# Patient Record
Sex: Female | Born: 1995 | Race: White | Hispanic: No | Marital: Married | Smoking: Never smoker
Health system: Southern US, Community
[De-identification: ages and names within clinical notes are randomized; demographics above are authoritative.]

## PROBLEM LIST (undated history)

## (undated) DIAGNOSIS — Z8481 Family history of carrier of genetic disease: Secondary | ICD-10-CM

## (undated) DIAGNOSIS — Z803 Family history of malignant neoplasm of breast: Secondary | ICD-10-CM

## (undated) DIAGNOSIS — Z808 Family history of malignant neoplasm of other organs or systems: Secondary | ICD-10-CM

## (undated) HISTORY — DX: Family history of malignant neoplasm of other organs or systems: Z80.8

## (undated) HISTORY — DX: Family history of carrier of genetic disease: Z84.81

## (undated) HISTORY — DX: Family history of malignant neoplasm of breast: Z80.3

---

## 2015-09-30 ENCOUNTER — Ambulatory Visit (HOSPITAL_COMMUNITY)
Admission: EM | Admit: 2015-09-30 | Discharge: 2015-09-30 | Disposition: A | Discharge status: Home / Self Care | Payer: 59 | Attending: Internal Medicine | Admitting: Internal Medicine

## 2015-09-30 ENCOUNTER — Encounter (HOSPITAL_COMMUNITY): Payer: Self-pay | Admitting: Emergency Medicine

## 2015-09-30 DIAGNOSIS — Z79899 Other long term (current) drug therapy: Secondary | ICD-10-CM | POA: Diagnosis not present

## 2015-09-30 DIAGNOSIS — K120 Recurrent oral aphthae: Secondary | ICD-10-CM | POA: Diagnosis not present

## 2015-09-30 DIAGNOSIS — J029 Acute pharyngitis, unspecified: Secondary | ICD-10-CM | POA: Diagnosis not present

## 2015-09-30 LAB — POCT RAPID STREP A: Streptococcus, Group A Screen (Direct): NEGATIVE

## 2015-09-30 MED ORDER — MAGIC MOUTHWASH W/LIDOCAINE: 10.0000 mL | Freq: Three times a day (TID) | ORAL | 0 refills | Status: DC | PRN

## 2015-09-30 MED ORDER — CHLORHEXIDINE GLUCONATE 0.12% ORAL RINSE (MEDLINE KIT): 15.0000 mL | Freq: Two times a day (BID) | OROMUCOSAL | 0 refills | Status: DC

## 2015-09-30 NOTE — ED Triage Notes (Signed)
Pt is here for ST onset 9/19 associated w/canker sore inside lower lip  Denies fevers, chills   A&O x4... NAD

## 2015-09-30 NOTE — ED Provider Notes (Signed)
CSN: [id number]     Arrival date & time 09/30/15  1012 History   First MD Initiated Contact with Patient 09/30/15 1056     Chief Complaint  Patient presents with  . Sore Throat   (Consider location/radiation/quality/duration/timing/severity/associated sxs/prior Treatment) HPI Miranda Moran is a 20 y.o. female presenting to UC with c/o canker sore as well as sore throat that started about 2 days ago.  She initially developed a sore on her lower inside lip, then gradually progressed to her throat.  No known sick contacts. She has had smaller canker sores in the past after drinking acidic drinks but this sore seems worse than prior sores.  Denies fever, chills, n/v/d. Denies cough or congestion. No sick contacts or recent travel. Denies headaches.    History reviewed. No pertinent past medical history. History reviewed. No pertinent surgical history. No family history on file. Social History  Substance Use Topics  . Smoking status: Never Smoker  . Smokeless tobacco: Never Used  . Alcohol use Yes   OB History    No data available     Review of Systems  Constitutional: Negative for chills and fever.  HENT: Positive for mouth sores and sore throat. Negative for congestion, ear pain, rhinorrhea and sinus pressure.   Respiratory: Negative for cough and shortness of breath.   Cardiovascular: Negative for chest pain and palpitations.  Gastrointestinal: Negative for abdominal pain, diarrhea, nausea and vomiting.    Allergies  Review of patient's allergies indicates no known allergies.  Home Medications   Prior to Admission medications   Medication Sig Start Date End Date Taking? Authorizing Provider  chlorhexidine gluconate, MEDLINE KIT, (PERIDEX) 0.12 % solution Use as directed 15 mLs in the mouth or throat 2 (two) times daily. Swish, gargle and spit 09/30/15   Noland Fordyce, PA-C  magic mouthwash w/lidocaine SOLN Take 10 mLs by mouth 3 (three) times daily as needed for mouth pain.  Swish, gargle, and spit 09/30/15   Noland Fordyce, PA-C   Meds Ordered and Administered this Visit  Medications - No data to display  BP 113/56 (BP Location: Right Arm)   Pulse 86   Temp 98.1 F (36.7 C) (Oral)   Resp 16   LMP 09/13/2015   SpO2 100%  No data found.   Physical Exam  Constitutional: She is oriented to person, place, and time. She appears well-developed and well-nourished. No distress.  HENT:  Head: Normocephalic and atraumatic.  Right Ear: Tympanic membrane normal.  Left Ear: Tympanic membrane normal.  Nose: Nose normal.  Mouth/Throat: Uvula is midline and mucous membranes are normal. Oral lesions present. Posterior oropharyngeal erythema present. No oropharyngeal exudate, posterior oropharyngeal edema or tonsillar abscesses.    Oral sore to inside lower lip, buccal mucosa side. No bleeding or discharge. Tender.  Eyes: EOM are normal.  Neck: Normal range of motion. Neck supple.  Cardiovascular: Normal rate and regular rhythm.   Pulmonary/Chest: Effort normal and breath sounds normal. No stridor. No respiratory distress. She has no wheezes. She has no rales.  Musculoskeletal: Normal range of motion.  Lymphadenopathy:    She has no cervical adenopathy.  Neurological: She is alert and oriented to person, place, and time.  Skin: Skin is warm and dry. She is not diaphoretic.  Psychiatric: She has a normal mood and affect. Her behavior is normal.  Nursing note and vitals reviewed.   Urgent Care Course   Clinical Course    Procedures (including critical care time)  Labs Review Labs Reviewed  POCT RAPID STREP A    Imaging Review No results found.   MDM   1. Sore throat   2. Canker sores oral    Pt c/o sore throat and oral sore for about 2 days. No evidence of peritonsillar abscess.  Rapid strep: Negative  Rx: Chlorehexidine rinse and magic mouthwash with lidocaine  Encouraged to use rinse twice a day, wait about 10-20 minutes before then using  magic mouthwash for pain relief. Home care instructions provided. F/u with PCP or dentist if not improving in 1 week Patient verbalized understanding and agreement with treatment plan.     Noland Fordyce, PA-C 09/30/15 1140

## 2015-10-03 LAB — CULTURE, GROUP A STREP (THRC)

## 2016-07-14 DIAGNOSIS — H5213 Myopia, bilateral: Secondary | ICD-10-CM | POA: Diagnosis not present

## 2016-12-08 ENCOUNTER — Other Ambulatory Visit (HOSPITAL_COMMUNITY)
Admission: RE | Admit: 2016-12-08 | Discharge: 2016-12-08 | Disposition: A | Discharge status: Home / Self Care | Payer: 59 | Source: Ambulatory Visit | Attending: Physician Assistant | Admitting: Physician Assistant

## 2016-12-08 ENCOUNTER — Other Ambulatory Visit: Payer: Self-pay | Admitting: Physician Assistant

## 2016-12-08 DIAGNOSIS — N926 Irregular menstruation, unspecified: Secondary | ICD-10-CM | POA: Diagnosis not present

## 2016-12-08 DIAGNOSIS — Z23 Encounter for immunization: Secondary | ICD-10-CM | POA: Diagnosis not present

## 2016-12-08 DIAGNOSIS — Z13220 Encounter for screening for lipoid disorders: Secondary | ICD-10-CM | POA: Diagnosis not present

## 2016-12-08 DIAGNOSIS — Z124 Encounter for screening for malignant neoplasm of cervix: Secondary | ICD-10-CM | POA: Diagnosis not present

## 2016-12-08 DIAGNOSIS — Z0000 Encounter for general adult medical examination without abnormal findings: Secondary | ICD-10-CM | POA: Diagnosis not present

## 2016-12-11 LAB — CYTOLOGY - PAP: Diagnosis: NEGATIVE

## 2016-12-12 DIAGNOSIS — Z111 Encounter for screening for respiratory tuberculosis: Secondary | ICD-10-CM | POA: Diagnosis not present

## 2017-06-08 DIAGNOSIS — Z23 Encounter for immunization: Secondary | ICD-10-CM | POA: Diagnosis not present

## 2017-09-02 ENCOUNTER — Encounter (HOSPITAL_COMMUNITY): Payer: Self-pay | Admitting: Emergency Medicine

## 2017-09-02 ENCOUNTER — Ambulatory Visit (HOSPITAL_COMMUNITY)
Admission: EM | Admit: 2017-09-02 | Discharge: 2017-09-02 | Disposition: A | Discharge status: Home / Self Care | Payer: 59 | Attending: Internal Medicine | Admitting: Internal Medicine

## 2017-09-02 ENCOUNTER — Other Ambulatory Visit: Payer: Self-pay

## 2017-09-02 DIAGNOSIS — T7840XA Allergy, unspecified, initial encounter: Secondary | ICD-10-CM

## 2017-09-02 DIAGNOSIS — W57XXXA Bitten or stung by nonvenomous insect and other nonvenomous arthropods, initial encounter: Secondary | ICD-10-CM

## 2017-09-02 DIAGNOSIS — S00261A Insect bite (nonvenomous) of right eyelid and periocular area, initial encounter: Secondary | ICD-10-CM

## 2017-09-02 DIAGNOSIS — R220 Localized swelling, mass and lump, head: Secondary | ICD-10-CM

## 2017-09-02 MED ORDER — CEPHALEXIN 500 MG PO CAPS: 500.0000 mg | ORAL_CAPSULE | Freq: Four times a day (QID) | ORAL | 0 refills | Status: AC

## 2017-09-02 MED ORDER — PREDNISONE 20 MG PO TABS: 40.0000 mg | ORAL_TABLET | Freq: Every day | ORAL | 0 refills | Status: AC

## 2017-09-02 MED ORDER — FAMOTIDINE 20 MG PO TABS: 20.0000 mg | ORAL_TABLET | Freq: Two times a day (BID) | ORAL | 0 refills | Status: DC

## 2017-09-02 NOTE — ED Provider Notes (Signed)
[facility]    CSN: [id number] Arrival date & time: 09/02/17  1229     History   Chief Complaint Chief Complaint  Patient presents with  . Facial Swelling    HPI Miranda Moran is a 22 y.o. female.   Miranda Moran presents with complaints of right facial swelling she noticed when she woke yesterday morning which has worsened today. Is not painful and does not itch. States she is concerned about bug bites as she has strong reactions to bug bites/ fire ant bites in the past. Was originally two small pinpoint areas to face and the swelling has extended. Has taken benadryl and applied hydrocortisone which have not helped. No vision change. States tissue just feels a pressure. Complaints no cough, shortness of breath , difficulty swallowing, speaking or breathing. No other rash. No drainage. Without contributing medical history.      ROS per HPI.      History reviewed. No pertinent past medical history.  There are no active problems to display for this patient.   History reviewed. No pertinent surgical history.  OB History   None      Home Medications    Prior to Admission medications   Medication Sig Start Date End Date Taking? Authorizing Provider  cephALEXin (KEFLEX) 500 MG capsule Take 1 capsule (500 mg total) by mouth 4 (four) times daily for 7 days. 09/02/17 09/09/17  Georgetta Haber, NP  famotidine (PEPCID) 20 MG tablet Take 1 tablet (20 mg total) by mouth 2 (two) times daily for 7 days. 09/02/17 09/09/17  Georgetta Haber, NP  predniSONE (DELTASONE) 20 MG tablet Take 2 tablets (40 mg total) by mouth daily with breakfast for 5 days. 09/02/17 09/07/17  Georgetta Haber, NP    Family History Family History  Problem Relation Age of Onset  . Cancer Father     Social History Social History   Tobacco Use  . Smoking status: Never Smoker  . Smokeless tobacco: Never Used  Substance Use Topics  . Alcohol use: Yes  . Drug use: Not on file     Allergies     Patient has no known allergies.   Review of Systems Review of Systems   Physical Exam Triage Vital Signs ED Triage Vitals  Enc Vitals Group     BP 09/02/17 1320 107/79     Pulse Rate 09/02/17 1320 66     Resp 09/02/17 1320 18     Temp 09/02/17 1320 98.4 F (36.9 C)     Temp Source 09/02/17 1320 Oral     SpO2 09/02/17 1320 100 %     Weight --      Height --      Head Circumference --      Peak Flow --      Pain Score 09/02/17 1317 0     Pain Loc --      Pain Edu? --      Excl. in GC? --    No data found.  Updated Vital Signs BP 107/79 (BP Location: Left Arm)   Pulse 66   Temp 98.4 F (36.9 C) (Oral)   Resp 18   LMP 08/31/2017   SpO2 100%    Physical Exam  Constitutional: She is oriented to person, place, and time. She appears well-developed and well-nourished. No distress.  HENT:  Head:    Mouth/Throat: Oropharynx is clear and moist.  Two pinpoint areas to right lateral cheek, white in appearance, with surrounding soft tissue  swelling and redness; no induration; no tender; no drainage or breakdown of skin; no fluctuance; see photos   Eyes: Pupils are equal, round, and reactive to light. Conjunctivae and EOM are normal.  Cardiovascular: Normal rate, regular rhythm and normal heart sounds.  Pulmonary/Chest: Effort normal and breath sounds normal. She has no wheezes.  Neurological: She is alert and oriented to person, place, and time.  Skin: Skin is warm and dry.         UC Treatments / Results  Labs (all labs ordered are listed, but only abnormal results are displayed) Labs Reviewed - No data to display  EKG None  Radiology No results found.  Procedures Procedures (including critical care time)  Medications Ordered in UC Medications - No data to display  Initial Impression / Assessment and Plan / UC Course  I have reviewed the triage vital signs and the nursing notes.  Pertinent labs & imaging results that were available during my care of  the patient were reviewed by me and considered in my medical decision making (see chart for details).     Two areas to cheek which do appear like possibly bug bites vs small abscess causing surrounding soft tissue swelling of lower lid. Will cover with keflex as well as 5 days of prednisone. Continue with benadryl, prilosec daily as well. Strict return precautions especially if this is infectious rather than allergic. Patient verbalized understanding and agreeable to plan.   Final Clinical Impressions(s) / UC Diagnoses   Final diagnoses:  Allergic reaction, initial encounter  Right facial swelling  Insect bite of right periocular area, initial encounter     Discharge Instructions     Continue with benadryl regularly.  May use hydrocortisone topically as well.  Will cover for infection as well with keflex, complete course.  Ice application to help with swelling.  5 days of prednisone.  Twice a day pepcid is also helpful with allergic responses.  If no improvement or if worsening of redness, pain, drainage, fevers, or otherwise worsening please return or go to the Er.     ED Prescriptions    Medication Sig Dispense Auth. Provider   predniSONE (DELTASONE) 20 MG tablet Take 2 tablets (40 mg total) by mouth daily with breakfast for 5 days. 10 tablet Linus MakoBurky, Aubryn Spinola B, NP   cephALEXin (KEFLEX) 500 MG capsule Take 1 capsule (500 mg total) by mouth 4 (four) times daily for 7 days. 28 capsule Linus MakoBurky, Dwan Hemmelgarn B, NP   famotidine (PEPCID) 20 MG tablet Take 1 tablet (20 mg total) by mouth 2 (two) times daily for 7 days. 14 tablet Georgetta HaberBurky, Prateek Knipple B, NP     Controlled Substance Prescriptions [place] Controlled Substance Registry consulted? Not Applicable   Georgetta HaberBurky, Lorrin Nawrot B, NP 09/02/17 1359

## 2017-09-02 NOTE — ED Triage Notes (Signed)
Right facial swelling noticed yesterday.  Used ice cortisone cream and benadryl used yesterday.  Patient reports increased swelling today.    Patient has had a history of fire ants exposure and feels this is the same type of incident.    Patient has 2 bites? To right cheek.  No itching, but feels pressure

## 2017-09-02 NOTE — Discharge Instructions (Addendum)
Continue with benadryl regularly.  May use hydrocortisone topically as well.  Will cover for infection as well with keflex, complete course.  Ice application to help with swelling.  5 days of prednisone.  Twice a day pepcid is also helpful with allergic responses.  If no improvement or if worsening of redness, pain, drainage, fevers, or otherwise worsening please return or go to the Er.

## 2017-09-25 DIAGNOSIS — Z1389 Encounter for screening for other disorder: Secondary | ICD-10-CM | POA: Diagnosis not present

## 2017-09-25 DIAGNOSIS — Z3040 Encounter for surveillance of contraceptives, unspecified: Secondary | ICD-10-CM | POA: Diagnosis not present

## 2017-09-25 DIAGNOSIS — Z3009 Encounter for other general counseling and advice on contraception: Secondary | ICD-10-CM | POA: Diagnosis not present

## 2017-09-25 DIAGNOSIS — Z01419 Encounter for gynecological examination (general) (routine) without abnormal findings: Secondary | ICD-10-CM | POA: Diagnosis not present

## 2017-09-25 DIAGNOSIS — R208 Other disturbances of skin sensation: Secondary | ICD-10-CM | POA: Diagnosis not present

## 2017-09-25 DIAGNOSIS — Z113 Encounter for screening for infections with a predominantly sexual mode of transmission: Secondary | ICD-10-CM | POA: Diagnosis not present

## 2017-09-25 DIAGNOSIS — Z3202 Encounter for pregnancy test, result negative: Secondary | ICD-10-CM | POA: Diagnosis not present

## 2017-09-25 DIAGNOSIS — Z803 Family history of malignant neoplasm of breast: Secondary | ICD-10-CM | POA: Diagnosis not present

## 2017-09-25 DIAGNOSIS — Z6824 Body mass index (BMI) 24.0-24.9, adult: Secondary | ICD-10-CM | POA: Diagnosis not present

## 2017-09-25 DIAGNOSIS — Z124 Encounter for screening for malignant neoplasm of cervix: Secondary | ICD-10-CM | POA: Diagnosis not present

## 2017-10-02 ENCOUNTER — Telehealth: Payer: Self-pay | Admitting: Licensed Clinical Social Worker

## 2017-10-02 ENCOUNTER — Encounter: Payer: Self-pay | Admitting: Licensed Clinical Social Worker

## 2017-10-02 ENCOUNTER — Telehealth: Payer: Self-pay | Admitting: Genetics

## 2017-10-02 NOTE — Telephone Encounter (Signed)
New referral received from Kendrick Friesorothy Sacrifes for genetic counseling. Pt has been scheduled to see Ike BeneBrianna Teapole on 10/17 at 9am. Pt aware t arrive 15 minutes early. Letter mailed.

## 2017-10-02 NOTE — Telephone Encounter (Signed)
Pt has been rescheduled to see Miranda DashLindsay Moran on 10/14 at 2pm for genetic counseling

## 2017-10-22 ENCOUNTER — Inpatient Hospital Stay: Payer: 59

## 2017-10-22 ENCOUNTER — Inpatient Hospital Stay: Payer: 59 | Attending: Genetic Counselor | Admitting: Genetics

## 2017-10-22 DIAGNOSIS — Z808 Family history of malignant neoplasm of other organs or systems: Secondary | ICD-10-CM | POA: Diagnosis not present

## 2017-10-22 DIAGNOSIS — Z8481 Family history of carrier of genetic disease: Secondary | ICD-10-CM | POA: Diagnosis not present

## 2017-10-22 DIAGNOSIS — Z803 Family history of malignant neoplasm of breast: Secondary | ICD-10-CM

## 2017-10-23 ENCOUNTER — Encounter: Payer: Self-pay | Admitting: Genetics

## 2017-10-23 DIAGNOSIS — Z803 Family history of malignant neoplasm of breast: Secondary | ICD-10-CM | POA: Insufficient documentation

## 2017-10-23 DIAGNOSIS — Z8481 Family history of carrier of genetic disease: Secondary | ICD-10-CM | POA: Insufficient documentation

## 2017-10-23 DIAGNOSIS — Z808 Family history of malignant neoplasm of other organs or systems: Secondary | ICD-10-CM | POA: Insufficient documentation

## 2017-10-23 NOTE — Progress Notes (Addendum)
REFERRING PROVIDER: Scifres, Dorothy, PA-C [place], [place] [address]  PRIMARY PROVIDER:  System, Provider Not In  PRIMARY REASON FOR VISIT:  1. Family history of BRCA gene mutation   2. Family history of breast cancer   3. Family history of brain cancer     HISTORY OF PRESENT ILLNESS:   Ms. Lapid, a 22 y.o. female, was seen for a [place] cancer genetics consultation at the request of Dr. Perlie Mayo due to a family history of breast cancer and a BRCA mutation (patient does not know which BRCA gene).  Ms. Montenegro presents to clinic today to discuss the possibility of a hereditary predisposition to cancer, genetic testing, and to further clarify her future cancer risks, as well as potential cancer risks for family members.   Ms. Palm is a 22 y.o. female with no personal history of cancer.    HORMONAL RISK FACTORS:  Menarche was at age 14.  First live birth at age N/A.  OCP use for approximately 7 years.  Ovaries intact: yes.  Hysterectomy: no.  Menopausal status: premenopausal.  HRT use: 0 years. Colonoscopy: no; not examined. Mammogram within the last year: no. Number of breast biopsies: 0. Pt reports having fibrocystic breast tissue, and had a cyst that was imaged with ultrasound at the age of 70.   Past Medical History:  Diagnosis Date  . Family history of brain cancer   . Family history of BRCA gene mutation   . Family history of breast cancer     No past surgical history on file.  Social History   Socioeconomic History  . Marital status: Married    Spouse name: Not on file  . Number of children: Not on file  . Years of education: Not on file  . Highest education level: Not on file  Occupational History  . Not on file  Social Needs  . Financial resource strain: Not on file  . Food insecurity:    Worry: Not on file    Inability: Not on file  . Transportation needs:    Medical: Not on file    Non-medical: Not on file   Tobacco Use  . Smoking status: Never Smoker  . Smokeless tobacco: Never Used  Substance and Sexual Activity  . Alcohol use: Yes  . Drug use: Not on file  . Sexual activity: Yes    Birth control/protection: None  Lifestyle  . Physical activity:    Days per week: Not on file    Minutes per session: Not on file  . Stress: Not on file  Relationships  . Social connections:    Talks on phone: Not on file    Gets together: Not on file    Attends religious service: Not on file    Active member of club or organization: Not on file    Attends meetings of clubs or organizations: Not on file    Relationship status: Not on file  Other Topics Concern  . Not on file  Social History Narrative  . Not on file     FAMILY HISTORY:  We obtained a detailed, 4-generation family history.  Significant diagnoses are listed below: Family History  Problem Relation Age of Onset  . Breast cancer Father 61  . Parkinson's disease Maternal Grandmother   . Dementia Maternal Grandmother   . Diabetes Maternal Grandfather   . Heart disease Maternal Grandfather   . Breast cancer Paternal Grandmother 2  . Melanoma Paternal Grandfather   .  Liver cancer Paternal Grandfather   . Breast cancer Other   . Breast cancer Other   . Brain cancer Other     Ms. Alonge has no children.  She has a 34 year-old brother with no history of cancer.   Ms. Lamay's father: is 109, hx of breast cancer.  It has metastasized to a location on his face, and the tumor markers indicated this tumor was breast cancer.  He then underwent genetic testing, and was positive for a BRCA mutation.  The patient does not have documentation and is unsure if she could get it, and is unsure which BRCA gene the mutation was in (BRCA1 or BRCA2).  Paternal Aunts/Uncles: none, father was an only child Paternal cousins: none Paternal grandfather: died in middle age, hx of melanoma and liver cancer Paternal grandmother:78, hx of breast  cancer dx in her 64's.  She had genetic testing that was positive for a BRCA mutation as well. She has 2 sisters who had breast cancer, a sister who had brain cancer, and 2 nieces (pateint's cousin 1x removed) with breast cancer.   Ms. Jeffords mother: 41, no hx of cancer.  Maternal Aunts/Uncles: 2 maternal aunts and 3 maternal uncles with no hx of cancer.  They are in their 50's/60's.  Maternal cousins: no history of cancer. 1 had breast lumps worked up, but it was not cancer.  Maternal grandfather: in his 67's, diabetes and heart disease Maternal grandmother:80's/90's.  She has parkinson's disease and dementia.   Patient's maternal ancestors are of Irish/[place] descent, and paternal ancestors are of [place] descent. There is no reported Ashkenazi Jewish ancestry. There is no known consanguinity.  GENETIC COUNSELING ASSESSMENT: Laketia Vicknair is a 22 y.o. female with a family history of Hereditary Breast and Ovarian Cancer Syndrome (HBOC) caused by a mutation in the BRCA1/2 genes.   therefore, discussed and recommended the following at today's visit.   DISCUSSION: We reviewed the characteristics, features and inheritance patterns of hereditary cancer syndromes. We also discussed genetic testing, including the appropriate family members to test, the process of testing, insurance coverage and turn-around-time for results. We discussed the implications of a negative, positive and/or variant of uncertain significant result. We offered Ms. Sockwell the option to pursue genetic testing of a pan cancer panel.  We suggested the Common Hereditary Cancers Panel.  Ms. Pleitez, indicated she would prefer a smaller targeted panel that only analyzes breast cancer genes and was not interested in a pan-cancer panel.  We therefore ordered the [facility] Breast Cancer Panel:  ATM BARD1 BRCA1 BRCA2 BRIP1 CDH1 CHEK2 NBN NF1 PALB2 PTEN RAD50 STK11 TP53   We discussed that one of the most common hereditary  cancer syndromes that increases breast cancer risk is called Hereditary Breast and Ovarian Cancer (HBOC) syndrome.  This syndrome is caused by mutations in the BRCA1 and BRCA2 genes.  This syndrome increases an individual's lifetime risk to develop breast, ovarian, pancreatic, and other types of cancer.  We discussed potential medical management options for women with BRCA mutations (increased surveillance, surgical options, etc). We also discussed individuals with BRCA2 mutations are carriers for Fanconi Anemia, and the reproductive implications of carrying a BRCA mutation.   Because her father is reportedly a carrier of a BRCA mutation, Ms. Packman and her brother have a 50% chance to have inherited this mutation.   We discussed that there are also many other cancer predisposition syndromes caused by mutations in several other genes.  We discussed that if she is  found to have a mutation in one of these genes, it may impact future medical management recommendations such as increased cancer screenings and consideration of risk reducing surgeries.  A positive result could also have implications for the patient's family members.  A Negative result would mean we were unable to identify a hereditary predisposition to cancer in her.  This would mean she has not inherited her father's BRCA mutation and is likely at close to the average woman's risk for breast cancer.  However, genetic testing is not perfect and can never completely rule out any hereditary cancer risk.   There could be mutations that are undetectable by current technology, or in genes not yet tested or identified to increase cancer risk.    We discussed the potential to find a Variant of Uncertain Significance or VUS.  These are variants that have not yet been identified as pathogenic or benign, and it is unknown if this variant is associated with increased cancer risk or if this is a normal finding.  Most VUS's are reclassified to benign or  likely benign.   It should not be used to make medical management decisions. With time, we suspect the lab will determine the significance of any VUS's identified if any.   Based on Ms. Lantier's family history of HBOC/breast cancer, she meets medical criteria for genetic testing. Despite that she meets criteria, she may still have an out of pocket cost. The laboratory can provide her with an estimate of her OOP cost.  she was given the contact information for the laboratory if she has further questions.   We discussed that some people do not want to undergo genetic testing due to fear of genetic discrimination.  A federal law called the Genetic Information Non-Discrimination Act (GINA) of 2008 helps protect individuals against genetic discrimination based on their genetic test results.  It impacts both health insurance and employment.  For health insurance, it protects against increased premiums, being kicked off insurance or being forced to take a test in order to be insured.  For employment it protects against hiring, firing and promoting decisions based on genetic test results.  Health status due to a cancer diagnosis is not protected under GINA.  This law does not protect life insurance, disability insurance, or other types of insurance.   PLAN: After considering the risks, benefits, and limitations, Ms. Banning  provided informed consent to pursue genetic testing and the blood sample was sent to [facility] for analysis of the Breast Cancer Panel. Results should be available within approximately 2-3 weeks' time, at which point they will be disclosed by telephone to Ms. Blasco, as will any additional recommendations warranted by these results. Ms. Karnik will receive a summary of her genetic counseling visit and a copy of her results once available. This information will also be available in Epic. We encouraged Ms. Koenen to remain in contact with cancer genetics annually  so that we can continuously update the family history and inform her of any changes in cancer genetics and testing that may be of benefit for her family. Ms. Marter questions were answered to her satisfaction today. Our contact information was provided should additional questions or concerns arise.  Based on Ms. Mcartor's family history, we recommended her brother and paternal relatives all have have genetic counseling and testing. Ms. Firestine will let us know if we can be of any assistance in coordinating genetic counseling and/or testing for this family member.   Lastly, we encouraged Ms. Vasconcelos  to remain in contact with cancer genetics annually so that we can continuously update the family history and inform her of any changes in cancer genetics and testing that may be of benefit for this family.   Ms.  Jacome questions were answered to her satisfaction today. Our contact information was provided should additional questions or concerns arise. Thank you for the referral and allowing [place] to share in the care of your patient.   Tana Felts, MS, [facility] Certified Genetic Counselor [email or web address] phone: [phone]  The patient was seen for a total of 50 minutes in face-to-face genetic counseling.  The patient was accompanied today by her husband, Elberta Fortis. This patient was discussed with Drs. Magrinat, Lindi Adie and/or Burr Medico who agrees with the above.

## 2017-10-25 ENCOUNTER — Encounter: Payer: 59 | Admitting: Licensed Clinical Social Worker

## 2017-10-25 ENCOUNTER — Other Ambulatory Visit: Payer: 59

## 2017-11-05 ENCOUNTER — Telehealth: Payer: Self-pay | Admitting: Genetics

## 2017-11-05 NOTE — Telephone Encounter (Signed)
Revealed negative genetic testing.   We did not have documentation of this, but per pt report father is BRCA carrier- her test was negative for the BRCA and other breast cancer genes, therefore this is considered a true negative result.  Her cancer risk is likely close to the average population (although family history may still slightly impact risk)  However, genetic testing is not perfect, and cannot definitively rule out a hereditary predisposition to cancer cause.

## 2017-11-09 ENCOUNTER — Encounter: Payer: Self-pay | Admitting: Genetics

## 2017-11-09 ENCOUNTER — Ambulatory Visit: Payer: Self-pay | Admitting: Genetics

## 2017-11-09 DIAGNOSIS — Z808 Family history of malignant neoplasm of other organs or systems: Secondary | ICD-10-CM

## 2017-11-09 DIAGNOSIS — Z1379 Encounter for other screening for genetic and chromosomal anomalies: Secondary | ICD-10-CM

## 2017-11-09 DIAGNOSIS — Z803 Family history of malignant neoplasm of breast: Secondary | ICD-10-CM

## 2017-11-09 DIAGNOSIS — Z8481 Family history of carrier of genetic disease: Secondary | ICD-10-CM

## 2017-11-09 NOTE — Progress Notes (Signed)
HPI:  Miranda Moran was previously seen in the [facility] on 10/22/2017 due to a family history of a BRCA mutation in her father. Please refer to our prior cancer genetics clinic note for more information regarding Miranda Moran's medical, social and family histories, and our assessment and recommendations, at the time. Miranda Moran recent genetic test results were disclosed to her, as well as recommendations warranted by these results. These results and recommendations are discussed in more detail below.  CANCER HISTORY:   No history exists.     FAMILY HISTORY:  We obtained a detailed, 4-generation family history.  Significant diagnoses are listed below: Family History  Problem Relation Age of Onset  . Breast cancer Father 38  . Parkinson's disease Maternal Grandmother   . Dementia Maternal Grandmother   . Diabetes Maternal Grandfather   . Heart disease Maternal Grandfather   . Breast cancer Paternal Grandmother 42  . Melanoma Paternal Grandfather   . Liver cancer Paternal Grandfather   . Breast cancer Other   . Breast cancer Other   . Brain cancer Other     Miranda Moran has no children.  She has a 60 year-old brother with no history of cancer.   Miranda Moran's father: is 44, hx of breast cancer.  It has metastasized to a location on his face, and the tumor markers indicated this tumor was breast cancer.  He then underwent genetic testing, and was positive for a BRCA mutation.  The patient does not have documentation and is unsure if she could get it, and is unsure which BRCA gene the mutation was in (BRCA1 or BRCA2).  Paternal Aunts/Uncles: none, father was an only child Paternal cousins: none Paternal grandfather: died in middle age, hx of melanoma and liver cancer Paternal grandmother:78, hx of breast cancer dx in her 49's.  She had genetic testing that was positive for a BRCA mutation as well. She has 2 sisters who had breast cancer, a sister  who had brain cancer, and 2 nieces (pateint's cousin 1x removed) with breast cancer.   Miranda Moran mother: 54, no hx of cancer.  Maternal Aunts/Uncles: 2 maternal aunts and 3 maternal uncles with no hx of cancer.  They are in their 50's/60's.  Maternal cousins: no history of cancer. 1 had breast lumps worked up, but it was not cancer.  Maternal grandfather: in his 61's, diabetes and heart disease Maternal grandmother:80's/90's.  She has parkinson's disease and dementia.   Patient's maternal ancestors are of Irish/[place] descent, and paternal ancestors are of [place] descent. There is no reported Ashkenazi Jewish ancestry. There is no known consanguinity.  GENETIC TEST RESULTS: Genetic testing performed through [facility] Breast Cancer Panel (patient declined a larger panel) reported out on 11/02/2017 showed no pathogenic mutations. 52 Genes analyaed: ATM, BARD1, BRCA1, BRCA2, BRIP1, CDH1, CHEK2, NBN, PALB2, PTEN, RAD50, STK11, TP53 .  The test report will be scanned into EPIC and will be located under the Molecular Pathology section of the Results Review tab. A portion of the result report is included below for reference.     We discussed with Miranda Moran that because current genetic testing is not perfect, it is possible there may be a gene mutation in one of these genes that current testing cannot detect, but that chance is small.  We also discussed, that there could be another gene that has not yet been discovered, or that we have not yet tested, that is responsible for the cancer diagnoses in the  family. It is also possible there is a hereditary cause for the cancer in the family that [name] did not inherit and therefore was not identified in her testing.  Therefore, it is important to remain in touch with cancer genetics in the future so that we can continue to offer [name] the most up to date genetic testing.   ADDITIONAL GENETIC TESTING: We discussed with Ms.  Moran that there are other genes that are associated with increased cancer risk that can be analyzed. The laboratories that offer this testing look at these additional genes via a hereditary cancer gene panel. Should Miranda Moran wish to pursue additional genetic testing, we are happy to discuss and coordinate this testing, at any time.    CANCER SCREENING RECOMMENDATIONS: This negative result means that no BRCA1/2 or other breast cancer risk mutations were identified in Miranda Moran.   She did not have a copy of her father's report for the lab to specifically comment on her father's specific mutation. However per pt report if her father was positive for a BRCA mutation, this would be conspired a true negative result for Ms. Alire.  The causative cancer  mutation in the family has been identified and Miranda Moran does not carry a BRCA mutation.  Therefore her risk to develop BRCA related cancers is likely at about the average woman's risk. (family and personal history may still impact this risk somewhat).       No other cancer causing mutations were identified in the other breast cancer risk genes analyzed.  Genetic testing is not perfect and therefore it is still possible that there could be genetic mutations that are undetectable by current technology, or genetic mutations in genes not tested or that have not been tested or identified to increase cancer risk.  Miranda Moran was advised to continue following the cancer screening guidelines provided by her healthcare providers. Other factors such as her personal and family history may still affect her cancer risk.    RECOMMENDATIONS FOR FAMILY MEMBERS:   It is important Miranda Moran's brother and all of her other paternal relatives also have genetic testing for the familial BRCA mutation.    All family members should inform their physicians about the family history of cancer so their doctors can make the most appropriate  screening recommendations for them.   FOLLOW-UP: Lastly, we discussed with [name] that cancer genetics is a rapidly advancing field and it is possible that new genetic tests will be appropriate for her and/or her family members in the future. We encouraged her to remain in contact with cancer genetics on an annual basis so we can update her personal and family histories and let her know of advances in cancer genetics that may benefit this family.   Our contact number was provided. Ms. Kruck questions were answered to her satisfaction, and she knows she is welcome to call us at anytime with additional questions or concerns.   Ferol Luz, MS, [facility] Certified Genetic Counselor [email or web address]

## 2017-11-26 DIAGNOSIS — Z111 Encounter for screening for respiratory tuberculosis: Secondary | ICD-10-CM | POA: Diagnosis not present

## 2017-12-17 ENCOUNTER — Encounter (HOSPITAL_COMMUNITY): Payer: Self-pay

## 2017-12-17 ENCOUNTER — Emergency Department (HOSPITAL_COMMUNITY): Payer: PRIVATE HEALTH INSURANCE

## 2017-12-17 ENCOUNTER — Emergency Department (HOSPITAL_COMMUNITY)
Admission: EM | Admit: 2017-12-17 | Discharge: 2017-12-17 | Disposition: A | Discharge status: Home / Self Care | Payer: PRIVATE HEALTH INSURANCE | Attending: Emergency Medicine | Admitting: Emergency Medicine

## 2017-12-17 ENCOUNTER — Other Ambulatory Visit: Payer: Self-pay

## 2017-12-17 DIAGNOSIS — Y93F9 Activity, other caregiving: Secondary | ICD-10-CM | POA: Insufficient documentation

## 2017-12-17 DIAGNOSIS — Z79899 Other long term (current) drug therapy: Secondary | ICD-10-CM | POA: Insufficient documentation

## 2017-12-17 DIAGNOSIS — Y990 Civilian activity done for income or pay: Secondary | ICD-10-CM | POA: Diagnosis not present

## 2017-12-17 DIAGNOSIS — S060X0A Concussion without loss of consciousness, initial encounter: Secondary | ICD-10-CM | POA: Diagnosis not present

## 2017-12-17 DIAGNOSIS — Y92230 Patient room in hospital as the place of occurrence of the external cause: Secondary | ICD-10-CM | POA: Insufficient documentation

## 2017-12-17 DIAGNOSIS — R51 Headache: Secondary | ICD-10-CM | POA: Diagnosis present

## 2017-12-17 MED ORDER — ACETAMINOPHEN 500 MG PO TABS
500.0000 mg | ORAL_TABLET | Freq: Once | ORAL | Status: AC
  Administered 2017-12-17: 500 mg via ORAL
  Filled 2017-12-17: qty 1

## 2017-12-17 MED ORDER — IBUPROFEN 800 MG PO TABS: 800.0000 mg | ORAL_TABLET | Freq: Once | ORAL | Status: DC
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
## 2017-12-17 NOTE — ED Notes (Signed)
Patient transported to CT 

## 2017-12-17 NOTE — ED Notes (Signed)
ED Provider at bedside. 

## 2017-12-17 NOTE — Discharge Instructions (Addendum)
As discussed, your evaluation today has been largely reassuring.  But, it is important that you monitor your condition carefully, and do not hesitate to return to the ED if you develop new, or concerning changes in your condition. ? ?Otherwise, please follow-up with your physician for appropriate ongoing care. ? ?

## 2017-12-24 DIAGNOSIS — H52223 Regular astigmatism, bilateral: Secondary | ICD-10-CM | POA: Diagnosis not present

## 2017-12-24 DIAGNOSIS — H5213 Myopia, bilateral: Secondary | ICD-10-CM | POA: Diagnosis not present
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
